# Patient Record
Sex: Male | Born: 1990 | Race: White | Hispanic: No | Marital: Single | Smoking: Never smoker
Health system: Southern US, Community
[De-identification: ages and names within clinical notes are randomized; demographics above are authoritative.]

## PROBLEM LIST (undated history)

## (undated) DIAGNOSIS — I456 Pre-excitation syndrome: Secondary | ICD-10-CM

## (undated) HISTORY — PX: ABLATION: SHX5711

---

## 2017-06-04 ENCOUNTER — Other Ambulatory Visit: Payer: Self-pay

## 2017-06-04 ENCOUNTER — Encounter (HOSPITAL_BASED_OUTPATIENT_CLINIC_OR_DEPARTMENT_OTHER): Payer: Self-pay

## 2017-06-04 ENCOUNTER — Emergency Department (HOSPITAL_BASED_OUTPATIENT_CLINIC_OR_DEPARTMENT_OTHER)
Admission: EM | Admit: 2017-06-04 | Discharge: 2017-06-05 | Disposition: A | Discharge status: Home / Self Care | Payer: BLUE CROSS/BLUE SHIELD | Attending: Emergency Medicine | Admitting: Emergency Medicine

## 2017-06-04 DIAGNOSIS — F329 Major depressive disorder, single episode, unspecified: Secondary | ICD-10-CM | POA: Diagnosis not present

## 2017-06-04 DIAGNOSIS — R45851 Suicidal ideations: Secondary | ICD-10-CM | POA: Diagnosis not present

## 2017-06-04 HISTORY — DX: Pre-excitation syndrome: I45.6

## 2017-06-04 LAB — CBC
HEMATOCRIT: 46.4 % (ref 39.0–52.0)
Hemoglobin: 15.8 g/dL (ref 13.0–17.0)
MCH: 27.2 pg (ref 26.0–34.0)
MCHC: 34.1 g/dL (ref 30.0–36.0)
MCV: 80 fL (ref 78.0–100.0)
PLATELETS: 284 10*3/uL (ref 150–400)
RBC: 5.8 MIL/uL (ref 4.22–5.81)
RDW: 13.7 % (ref 11.5–15.5)
WBC: 9.9 10*3/uL (ref 4.0–10.5)

## 2017-06-04 LAB — COMPREHENSIVE METABOLIC PANEL
ALK PHOS: 73 U/L (ref 38–126)
ALT: 34 U/L (ref 17–63)
ANION GAP: 10 (ref 5–15)
AST: 25 U/L (ref 15–41)
Albumin: 4.3 g/dL (ref 3.5–5.0)
BILIRUBIN TOTAL: 0.3 mg/dL (ref 0.3–1.2)
BUN: 9 mg/dL (ref 6–20)
CALCIUM: 8.8 mg/dL — AB (ref 8.9–10.3)
CO2: 24 mmol/L (ref 22–32)
Chloride: 102 mmol/L (ref 101–111)
Creatinine, Ser: 0.88 mg/dL (ref 0.61–1.24)
GFR calc Af Amer: >60 mL/min (ref >60)
GLUCOSE: 108 mg/dL — AB (ref 65–99)
POTASSIUM: 3.2 mmol/L — AB (ref 3.5–5.1)
Sodium: 136 mmol/L (ref 135–145)
TOTAL PROTEIN: 7.9 g/dL (ref 6.5–8.1)

## 2017-06-04 LAB — RAPID URINE DRUG SCREEN, HOSP PERFORMED
Amphetamines: NOT DETECTED
BARBITURATES: NOT DETECTED
Benzodiazepines: NOT DETECTED
Cocaine: NOT DETECTED
Opiates: NOT DETECTED
Tetrahydrocannabinol: NOT DETECTED

## 2017-06-04 LAB — ETHANOL

## 2017-06-04 LAB — SALICYLATE LEVEL: Salicylate Lvl: <7 mg/dL (ref 2.8–30.0)

## 2017-06-04 LAB — ACETAMINOPHEN LEVEL: Acetaminophen (Tylenol), Serum: <10 ug/mL — ABNORMAL LOW (ref 10–30)

## 2017-06-04 MED ORDER — POTASSIUM CHLORIDE CRYS ER 20 MEQ PO TBCR
40.0000 meq | EXTENDED_RELEASE_TABLET | Freq: Once | ORAL | Status: AC
  Administered 2017-06-04: 40 meq via ORAL
  Filled 2017-06-04: qty 2

## 2017-06-04 NOTE — ED Notes (Signed)
All personal belongings removed and placed in belongings bag and locked at nsg desk. Room cleared of all extra equipment per policy. Pt wearing burgundy scrubs and given socks. Pt wanded by security for weapons.

## 2017-06-04 NOTE — ED Notes (Signed)
Pt given water per request

## 2017-06-04 NOTE — ED Notes (Signed)
Pt states he is feeling depressed and unsafe to be alone. Pt reports thoughts of self harm, but he doesn't have a plan. Pt reports situation with friends today that made him feel like they were his friends. Pt also states he lives at home with his parents who are supportive, but he also identifies this situation as being a stressor. Pt is tearful at times. Pt is cooperative. Mother called to make sure pt was here and safe-permission was obtained from pt to let mother know he was here, safe and being evaluated. Plan of care explained to pt and he verbalized understanding.

## 2017-06-04 NOTE — ED Triage Notes (Signed)
Pt "I'm scared to go home because I think I might kill myself"-pt began crying-NAD-steady gait

## 2017-06-04 NOTE — ED Notes (Signed)
Staffing notified that a sitter was needed

## 2017-06-05 ENCOUNTER — Encounter (HOSPITAL_BASED_OUTPATIENT_CLINIC_OR_DEPARTMENT_OTHER): Payer: Self-pay | Admitting: Emergency Medicine

## 2017-06-05 NOTE — ED Notes (Signed)
TTS recommends pt can go home. Pt signed contract for safety. Pt given resource guide for out pt treatment.

## 2017-06-05 NOTE — BH Assessment (Addendum)
Tele Assessment Note   Patient Name: Ronald Diaz MRN: [id number] Referring Physician: Dr. Dena Billet Location of Patient: [facility] Location of Provider: Behavioral Health TTS Department  [name] is an 27 y.o. male. The pt came in after having suicidal thoughts.  He reported he had accidentally offended his best friend and his best friend's wife.  Today while at work his best friend's wife "went off" on him.  After hearing from his best friend's wife, the pt began to start having thoughts that he didn't matter.   He stated he started thinking about how he lives at home with his mother, doesn't have a girlfriend, and plays video games all day.  He said he is also having "drama" with his sister.  He started to have thoughts of shooting himself with his gun.  He denies ever trying to hurt himself in the past and any mental health history.  He denies any prior drug or substance use and his UDS was negative for all substances.  Prior to today he denied any depression symptoms.  He now denies SI and feels safe to go home.  He denies HI, SA and psychosis.  Diagnosis: F43.21 Adjustment disorder, With depressed mood   Past Medical History:  Past Medical History:  Diagnosis Date  . WPW (Wolff-Parkinson-White syndrome)     Past Surgical History:  Procedure Laterality Date  . ABLATION      Family History: No family history on file.  Social History:  reports that  has never smoked. he has never used smokeless tobacco. He reports that he drinks alcohol. He reports that he does not use drugs.  Additional Social History:  Alcohol / Drug Use Pain Medications: See MAR Prescriptions: See MAR Over the Counter: See MAR History of alcohol / drug use?: No history of alcohol / drug abuse Longest period of sobriety (when/how long): NA  CIWA: CIWA-Ar BP: (!) 139/92 Pulse Rate: (!) 101 COWS:    Allergies: No Known Allergies  Home Medications:  (Not in a hospital  admission)  OB/GYN Status:  No LMP for male patient.  General Assessment Data Location of Assessment: [facility] Assessment Services([facility]) TTS Assessment: In system Is this a Tele or Face-to-Face Assessment?: Tele Assessment Is this an Initial Assessment or a Re-assessment for this encounter?: Initial Assessment Marital status: Single Maiden name: NA Is patient pregnant?: Other (Comment)(male) Living Arrangements: Parent Can pt return to current living arrangement?: Yes Admission Status: Voluntary Is patient capable of signing voluntary admission?: Yes Referral Source: Self/Family/Friend Insurance type: BCBS     Crisis Care Plan Living Arrangements: Parent Legal Guardian: Other:(Self) Name of Psychiatrist: none Name of Therapist: none  Education Status Is patient currently in school?: No Current Grade: NA Highest grade of school patient has completed: 2 years of college Name of school: NA Contact person: NA  Risk to self with the past 6 months Suicidal Ideation: No-Not Currently/Within Last 6 Months Has patient been a risk to self within the past 6 months prior to admission? : Yes Suicidal Intent: No-Not Currently/Within Last 6 Months Has patient had any suicidal intent within the past 6 months prior to admission? : No Is patient at risk for suicide?: Yes Suicidal Plan?: No-Not Currently/Within Last 6 Months Has patient had any suicidal plan within the past 6 months prior to admission? : Yes Access to Means: Yes Specify Access to Suicidal Means: has a gun at home What has been your use of drugs/alcohol within the last 12 months?:  none Previous Attempts/Gestures: No How many times?: 0 Other Self Harm Risks: none Triggers for Past Attempts: None known Intentional Self Injurious Behavior: None Family Suicide History: No Recent stressful life event(s): Conflict (Comment)(argument with best friend's wife) Persecutory voices/beliefs?: No Depression:  No Depression Symptoms: Feeling worthless/self pity Substance abuse history and/or treatment for substance abuse?: No Suicide prevention information given to non-admitted patients: Yes  Risk to Others within the past 6 months Homicidal Ideation: No Does patient have any lifetime risk of violence toward others beyond the six months prior to admission? : No Thoughts of Harm to Others: No Current Homicidal Intent: No Current Homicidal Plan: No Access to Homicidal Means: No Identified Victim: NA History of harm to others?: No Assessment of Violence: None Noted Violent Behavior Description: none Does patient have access to weapons?: Yes (Comment)(gun at home on night stand in bedroom) Criminal Charges Pending?: No Does patient have a court date: No Is patient on probation?: No  Psychosis Hallucinations: None noted Delusions: None noted  Mental Status Report Appearance/Hygiene: In scrubs, Unremarkable Eye Contact: Good Motor Activity: Unremarkable, Freedom of movement Speech: Logical/coherent Level of Consciousness: Alert Mood: Depressed Affect: Appropriate to circumstance Anxiety Level: None Thought Processes: Coherent, Relevant Judgement: Partial Orientation: Person, Place, Time, Situation, Appropriate for developmental age Obsessive Compulsive Thoughts/Behaviors: None  Cognitive Functioning Concentration: Normal Memory: Recent Intact, Remote Intact IQ: Average Insight: Fair Impulse Control: Fair Appetite: Good Weight Loss: 0 Weight Gain: 0 Sleep: No Change Total Hours of Sleep: 8 Vegetative Symptoms: None  ADLScreening [facility] Assessment Services) Patient's cognitive ability adequate to safely complete daily activities?: Yes Patient able to express need for assistance with ADLs?: Yes Independently performs ADLs?: Yes (appropriate for developmental age)  Prior Inpatient Therapy Prior Inpatient Therapy: No Prior Therapy Dates: NA Prior Therapy Facilty/Provider(s):  NA Reason for Treatment: NA  Prior Outpatient Therapy Prior Outpatient Therapy: No Prior Therapy Dates: NA Prior Therapy Facilty/Provider(s): NA Reason for Treatment: NA Does patient have an ACCT team?: No Does patient have Intensive In-House Services?  : No Does patient have [facility] services? : No Does patient have P4CC services?: No  ADL Screening (condition at time of admission) Patient's cognitive ability adequate to safely complete daily activities?: Yes Patient able to express need for assistance with ADLs?: Yes Independently performs ADLs?: Yes (appropriate for developmental age)       Abuse/Neglect Assessment (Assessment to be complete while patient is alone) Abuse/Neglect Assessment Can Be Completed: Yes Physical Abuse: Denies Verbal Abuse: Denies Sexual Abuse: Denies Exploitation of patient/patient's resources: Denies Self-Neglect: Denies Values / Beliefs Cultural Requests During Hospitalization: None Spiritual Requests During Hospitalization: None Consults Spiritual Care Consult Needed: No Social Work Consult Needed: No Merchant navy officer (For Healthcare) Does Patient Have a Medical Advance Directive?: No Would patient like information on creating a medical advance directive?: No - Patient declined    Additional Information 1:1 In Past 12 Months?: No CIRT Risk: No Elopement Risk: No Does patient have medical clearance?: Yes     Disposition:  Disposition Initial Assessment Completed for this Encounter: Yes Disposition of Patient: Outpatient treatment Type of outpatient treatment: Adult  Donell Sievert, PA recommends outpatient treatment.  RN Caryl Asp, was made aware of the recommendations.  This service was provided via telemedicine using a 2-way, interactive audio and video technology.  Names of all persons participating in this telemedicine service and their role in this encounter. Name: Riley Churches Role: TTS  Name: Kary Kos Role: PT  Name:   Role:   Name:  Role:     Riley ChurchesGarvin, Janet Decesare [facility] HospitalJermaine 06/05/2017 12:21 AM

## 2017-06-05 NOTE — ED Notes (Signed)
TTS consult is complete

## 2017-06-05 NOTE — ED Provider Notes (Signed)
[facility] EMERGENCY DEPARTMENT Provider Note   CSN: [id number] Arrival date & time: 06/04/17  2132     History   Chief Complaint Chief Complaint  Patient presents with  . Suicidal    HPI Ronald Diaz is a 27 y.o. male.  The history is provided by the patient.  Depression  This is a new problem. The current episode started 12 to 24 hours ago. The problem occurs constantly. The problem has been rapidly improving. Pertinent negatives include no chest pain, no abdominal pain, no headaches and no shortness of breath. Nothing aggravates the symptoms. Nothing relieves the symptoms. He has tried nothing for the symptoms. The treatment provided mild relief.  Patient reports that his best friend's wife was angry at him because he said some "stupid things" to him.  He was upset that he might lose them as friends as he considers them more like family.  He transiently thought life might be better without him.  No ETOH.  No HI.  No AH or VH.  States he has never had this problem but he was really upset about losing them as friends.  Having those thoughts scared him so he came in for evaluation.    Past Medical History:  Diagnosis Date  . WPW (Wolff-Parkinson-White syndrome)     There are no active problems to display for this patient.   Past Surgical History:  Procedure Laterality Date  . ABLATION         Home Medications    Prior to Admission medications   Not on File    Family History No family history on file.  Social History Social History   Tobacco Use  . Smoking status: Never Smoker  . Smokeless tobacco: Never Used  Substance Use Topics  . Alcohol use: Yes    Comment: rare  . Drug use: No     Allergies   Patient has no known allergies.   Review of Systems Review of Systems  Respiratory: Negative for shortness of breath.   Cardiovascular: Negative for chest pain.  Gastrointestinal: Negative for abdominal pain.  Neurological: Negative for  headaches.  Psychiatric/Behavioral: Positive for depression. Negative for self-injury.  All other systems reviewed and are negative.    Physical Exam Updated Vital Signs BP (!) 139/92 (BP Location: Left Arm)   Pulse (!) 101   Temp 98.2 F (36.8 C) (Oral)   Resp 20   Ht 5\' 9"  (1.753 m)   Wt 99.8 kg (220 lb)   SpO2 98%   BMI 32.49 kg/m   Physical Exam  Constitutional: He is oriented to person, place, and time. He appears well-developed and well-nourished. No distress.  HENT:  Head: Normocephalic and atraumatic.  Nose: Nose normal.  Mouth/Throat: No oropharyngeal exudate.  Eyes: Conjunctivae are normal. Pupils are equal, round, and reactive to light.  Neck: Normal range of motion. Neck supple.  Cardiovascular: Normal rate, regular rhythm, normal heart sounds and intact distal pulses.  Pulmonary/Chest: Effort normal and breath sounds normal. No stridor. He has no wheezes. He has no rales.  Abdominal: Soft. Bowel sounds are normal. There is no tenderness.  Musculoskeletal: Normal range of motion.  Neurological: He is alert and oriented to person, place, and time. He displays normal reflexes.  Skin: Skin is warm and dry. Capillary refill takes less than 2 seconds.  Psychiatric: He has a normal mood and affect.     ED Treatments / Results  Labs (all labs ordered are listed, but only abnormal results  are displayed) Labs Reviewed  COMPREHENSIVE METABOLIC PANEL - Abnormal; Notable for the following components:      Result Value   Potassium 3.2 (*)    Glucose, Bld 108 (*)    Calcium 8.8 (*)    All other components within normal limits  ACETAMINOPHEN LEVEL - Abnormal; Notable for the following components:   Acetaminophen (Tylenol), Serum <10 (*)    All other components within normal limits  ETHANOL  SALICYLATE LEVEL  CBC  RAPID URINE DRUG SCREEN, HOSP PERFORMED    EKG  EKG Interpretation None       Radiology No results found.  Procedures Procedures (including  critical care time)  Medications Ordered in ED Medications  potassium chloride SA (K-DUR,KLOR-CON) CR tablet 40 mEq (40 mEq Oral Given 06/04/17 2342)      Final Clinical Impressions(s) / ED Diagnoses   Final diagnoses:  Depressive reaction    Cleared by TTS, contracted for safety is living with his parents.    Return for weakness, numbness, changes in vision or speech,  fevers > 100.4 unrelieved by medication, shortness of breath, intractable vomiting, or diarrhea, abdominal pain, Inability to tolerate liquids or food, cough, altered mental status or any concerns. No signs of systemic illness or infection. The patient is nontoxic-appearing on exam and vital signs are within normal limits.    I have reviewed the triage vital signs and the nursing notes. Pertinent labs &imaging results that were available during my care of the patient were reviewed by me and considered in my medical decision making (see chart for details).  After history, exam, and medical workup I feel the patient has been appropriately medically screened and is safe for discharge home. Pertinent diagnoses were discussed with the patient. Patient was given return precautions.   Ofilia Rayon, MD 06/05/17 [phone]

## 2019-05-02 ENCOUNTER — Other Ambulatory Visit: Payer: Self-pay

## 2019-05-02 ENCOUNTER — Encounter (HOSPITAL_BASED_OUTPATIENT_CLINIC_OR_DEPARTMENT_OTHER): Payer: Self-pay | Admitting: Emergency Medicine

## 2019-05-02 ENCOUNTER — Emergency Department (HOSPITAL_BASED_OUTPATIENT_CLINIC_OR_DEPARTMENT_OTHER)
Admission: EM | Admit: 2019-05-02 | Discharge: 2019-05-02 | Disposition: A | Discharge status: Home / Self Care | Payer: HRSA Program | Attending: Emergency Medicine | Admitting: Emergency Medicine

## 2019-05-02 ENCOUNTER — Emergency Department (HOSPITAL_BASED_OUTPATIENT_CLINIC_OR_DEPARTMENT_OTHER): Payer: HRSA Program

## 2019-05-02 DIAGNOSIS — U071 COVID-19: Secondary | ICD-10-CM | POA: Insufficient documentation

## 2019-05-02 DIAGNOSIS — R0600 Dyspnea, unspecified: Secondary | ICD-10-CM

## 2019-05-02 DIAGNOSIS — R042 Hemoptysis: Secondary | ICD-10-CM | POA: Diagnosis not present

## 2019-05-02 DIAGNOSIS — R0602 Shortness of breath: Secondary | ICD-10-CM | POA: Diagnosis present

## 2019-05-02 LAB — CBC WITH DIFFERENTIAL/PLATELET
Abs Immature Granulocytes: 0.01 10*3/uL (ref 0.00–0.07)
Basophils Absolute: 0 10*3/uL (ref 0.0–0.1)
Basophils Relative: 0 %
Eosinophils Absolute: 0 10*3/uL (ref 0.0–0.5)
Eosinophils Relative: 0 %
HCT: 49.1 % (ref 39.0–52.0)
Hemoglobin: 16.4 g/dL (ref 13.0–17.0)
Immature Granulocytes: 0 %
Lymphocytes Relative: 29 %
Lymphs Abs: 1.5 10*3/uL (ref 0.7–4.0)
MCH: 27.1 pg (ref 26.0–34.0)
MCHC: 33.4 g/dL (ref 30.0–36.0)
MCV: 81.2 fL (ref 80.0–100.0)
Monocytes Absolute: 0.3 10*3/uL (ref 0.1–1.0)
Monocytes Relative: 6 %
Neutro Abs: 3.5 10*3/uL (ref 1.7–7.7)
Neutrophils Relative %: 65 %
Platelets: 235 10*3/uL (ref 150–400)
RBC: 6.05 MIL/uL — ABNORMAL HIGH (ref 4.22–5.81)
RDW: 13.2 % (ref 11.5–15.5)
WBC: 5.4 10*3/uL (ref 4.0–10.5)
nRBC: 0 % (ref 0.0–0.2)

## 2019-05-02 LAB — COMPREHENSIVE METABOLIC PANEL
ALT: 60 U/L — ABNORMAL HIGH (ref 0–44)
AST: 41 U/L (ref 15–41)
Albumin: 4.4 g/dL (ref 3.5–5.0)
Alkaline Phosphatase: 75 U/L (ref 38–126)
Anion gap: 12 (ref 5–15)
BUN: 11 mg/dL (ref 6–20)
CO2: 21 mmol/L — ABNORMAL LOW (ref 22–32)
Calcium: 9.2 mg/dL (ref 8.9–10.3)
Chloride: 105 mmol/L (ref 98–111)
Creatinine, Ser: 0.81 mg/dL (ref 0.61–1.24)
GFR calc Af Amer: >60 mL/min (ref >60)
GFR calc non Af Amer: >60 mL/min (ref >60)
Glucose, Bld: 83 mg/dL (ref 70–99)
Potassium: 3.5 mmol/L (ref 3.5–5.1)
Sodium: 138 mmol/L (ref 135–145)
Total Bilirubin: 0.9 mg/dL (ref 0.3–1.2)
Total Protein: 7.9 g/dL (ref 6.5–8.1)

## 2019-05-02 LAB — PROCALCITONIN: Procalcitonin: <0.1 ng/mL

## 2019-05-02 LAB — TRIGLYCERIDES: Triglycerides: 176 mg/dL — ABNORMAL HIGH (ref <150)

## 2019-05-02 LAB — FERRITIN: Ferritin: 379 ng/mL — ABNORMAL HIGH (ref 24–336)

## 2019-05-02 LAB — C-REACTIVE PROTEIN: CRP: 1.4 mg/dL — ABNORMAL HIGH (ref <1.0)

## 2019-05-02 LAB — LACTATE DEHYDROGENASE: LDH: 152 U/L (ref 98–192)

## 2019-05-02 LAB — FIBRINOGEN: Fibrinogen: 441 mg/dL (ref 210–475)

## 2019-05-02 LAB — LACTIC ACID, PLASMA: Lactic Acid, Venous: 1.2 mmol/L (ref 0.5–1.9)

## 2019-05-02 LAB — D-DIMER, QUANTITATIVE: D-Dimer, Quant: <0.27 ug/mL-FEU (ref 0.00–0.50)

## 2019-05-02 MED ORDER — SODIUM CHLORIDE 0.9 % IV BOLUS
1000.0000 mL | Freq: Once | INTRAVENOUS | Status: AC
  Administered 2019-05-02: 17:00:00 1000 mL via INTRAVENOUS

## 2019-05-02 MED ORDER — ALBUTEROL SULFATE HFA 108 (90 BASE) MCG/ACT IN AERS: 1.0000 | INHALATION_SPRAY | Freq: Four times a day (QID) | RESPIRATORY_TRACT | 0 refills | Status: AC | PRN

## 2019-05-02 MED ORDER — IBUPROFEN 800 MG PO TABS
800.0000 mg | ORAL_TABLET | Freq: Once | ORAL | Status: AC
  Administered 2019-05-02: 800 mg via ORAL
  Filled 2019-05-02: qty 1

## 2019-05-02 MED ORDER — DEXAMETHASONE SODIUM PHOSPHATE 10 MG/ML IJ SOLN
10.0000 mg | Freq: Once | INTRAMUSCULAR | Status: AC
  Administered 2019-05-02: 10 mg via INTRAVENOUS
  Filled 2019-05-02: qty 1

## 2019-05-02 MED ORDER — METHYLPREDNISOLONE 4 MG PO TBPK: ORAL_TABLET | ORAL | 0 refills | Status: AC

## 2019-05-02 NOTE — ED Triage Notes (Signed)
Diagnosed with COVID Wednesday. C/o worsening SOB and cough.

## 2019-05-02 NOTE — ED Provider Notes (Signed)
[facility] EMERGENCY DEPARTMENT Provider Note   CSN: [id number] Arrival date & time: 05/02/19  1530     History Chief Complaint  Patient presents with  . Shortness of Breath    COVID+    Ronald Diaz is a 29 y.o. male who presents emergency department chief complaint of shortness of breath.  He has a past medical history of Wolff-Parkinson-White.  Patient was diagnosed with Covid virus last week at [facility].  Symptoms began on 04/26/2019.  Patient states that 3 days ago he began having worsening shortness of breath.  He used Allegra-D and Tylenol to relieve his symptoms.  He had improvement in his breathing and was able to expectorate a lot of phlegm.  Patient states that he noticed there is a little bit of 1 to 2 mm specks of blood in his phlegm at that time.  Patient states that his breathing has become worse.  He has had some retrosternal chest pain and has been coughing up more pink, blood-tinged sputum.  He spoke with a telehealth specialist who asked him to come to the emergency department. He denies a history of pulmonary embolus, unilateral leg swelling. HPI     Past Medical History:  Diagnosis Date  . WPW (Wolff-Parkinson-White syndrome)     There are no problems to display for this patient.   Past Surgical History:  Procedure Laterality Date  . ABLATION         No family history on file.  Social History   Tobacco Use  . Smoking status: Never Smoker  . Smokeless tobacco: Never Used  Substance Use Topics  . Alcohol use: Yes    Comment: rare  . Drug use: No    Home Medications Prior to Admission medications   Medication Sig Start Date End Date Taking? Authorizing Provider  albuterol (VENTOLIN HFA) 108 (90 Base) MCG/ACT inhaler Inhale 1-2 puffs into the lungs every 6 (six) hours as needed for wheezing or shortness of breath. 05/02/19   Margarita Mail, PA-C  methylPREDNISolone (MEDROL DOSEPAK) 4 MG TBPK tablet Use as directed 05/02/19   Margarita Mail, PA-C    Allergies    Patient has no known allergies.  Review of Systems   Review of Systems Ten systems reviewed and are negative for acute change, except as noted in the HPI.   Physical Exam Updated Vital Signs BP 126/80   Pulse (!) 107   Temp 98.9 F (37.2 C) (Oral)   Resp 20   Ht 5\' 9"  (1.753 m)   Wt 99.8 kg   SpO2 98%   BMI 32.49 kg/m   Physical Exam Vitals and nursing note reviewed.  Constitutional:      General: He is not in acute distress.    Appearance: He is well-developed. He is not diaphoretic.  HENT:     Head: Normocephalic and atraumatic.  Eyes:     General: No scleral icterus.    Conjunctiva/sclera: Conjunctivae normal.  Cardiovascular:     Rate and Rhythm: Regular rhythm. Tachycardia present.     Heart sounds: Normal heart sounds.  Pulmonary:     Effort: Pulmonary effort is normal. Tachypnea present. No respiratory distress.     Breath sounds: Normal breath sounds.  Abdominal:     Palpations: Abdomen is soft.     Tenderness: There is no abdominal tenderness.  Musculoskeletal:     Cervical back: Normal range of motion and neck supple.  Skin:    General: Skin is warm and dry.  Neurological:     Mental Status: He is alert.  Psychiatric:        Behavior: Behavior normal.     ED Results / Procedures / Treatments   Labs (all labs ordered are listed, but only abnormal results are displayed) Labs Reviewed  CBC WITH DIFFERENTIAL/PLATELET - Abnormal; Notable for the following components:      Result Value   RBC 6.05 (*)    All other components within normal limits  COMPREHENSIVE METABOLIC PANEL - Abnormal; Notable for the following components:   CO2 21 (*)    ALT 60 (*)    All other components within normal limits  FERRITIN - Abnormal; Notable for the following components:   Ferritin 379 (*)    All other components within normal limits  TRIGLYCERIDES - Abnormal; Notable for the following components:   Triglycerides 176 (*)    All  other components within normal limits  C-REACTIVE PROTEIN - Abnormal; Notable for the following components:   CRP 1.4 (*)    All other components within normal limits  SARS CORONAVIRUS 2 (TAT 6-24 HRS)  CULTURE, BLOOD (ROUTINE X 2)  CULTURE, BLOOD (ROUTINE X 2)  LACTIC ACID, PLASMA  D-DIMER, QUANTITATIVE (NOT AT [facility])  PROCALCITONIN  LACTATE DEHYDROGENASE  FIBRINOGEN    EKG EKG Interpretation  Date/Time:  Sunday May 02 2019 16:24:06 EST Ventricular Rate:  127 PR Interval:    QRS Duration: 71 QT Interval:  294 QTC Calculation: 428 R Axis:   57 Text Interpretation: Sinus tachycardia Probable left atrial enlargement No STEMI Confirmed by Alvester Chou [phone]) on 05/02/2019 4:36:43 PM   Radiology DG Chest Portable 1 View  Result Date: 05/02/2019 CLINICAL DATA:  C/o cough, body aches and fever x 1 wk but started having light pink hemoptysis when coughing since yesterday. Had positive COVID test on Wednesday this week EXAM: PORTABLE CHEST 1 VIEW COMPARISON:  None. FINDINGS: The heart size and mediastinal contours are within normal limits. The lungs are clear. No pneumothorax or significant pleural effusion. No acute finding in the visualized skeleton. IMPRESSION: No evidence of active disease in the chest. Electronically Signed   By: Emmaline Kluver M.D.   On: 05/02/2019 16:22    Procedures Procedures (including critical care time)  Medications Ordered in ED Medications  ibuprofen (ADVIL) tablet 800 mg (800 mg Oral Given 05/02/19 1635)  sodium chloride 0.9 % bolus 1,000 mL (0 mLs Intravenous Stopped 05/02/19 1800)  dexamethasone (DECADRON) injection 10 mg (10 mg Intravenous Given 05/02/19 1634)    ED Course  I have reviewed the triage vital signs and the nursing notes.  Pertinent labs & imaging results that were available during my care of the patient were reviewed by me and considered in my medical decision making (see chart for details).    MDM Rules/Calculators/A&P                       [id number] + Hemoptysis VS: BP 126/80   Pulse (!) 107   Temp 98.9 F (37.2 C) (Oral)   Resp 20   Ht 5\' 9"  (1.753 m)   Wt 99.8 kg   SpO2 98%   BMI 32.49 kg/m  is gathered by patient and EMR. DDX: Differential diagnosis of hemoptysis includes, but is not limited to: bronchitis, bronchiectasis, bronchogenic carcinoma, pulmonary vasculature disease, left ventricular failure, mitral stenosis, pulmonary embolism, AV malformation, diseases of the pulmonary parenchyma including pneumonia, abscess, tuberculosis, Aspergilloma parasitic infection, Goodpasture's or Wegener's granulomatosis, SLE,  crack cocaine inhalation, leukemia, anticoagulation or acute cough. Labs: I reviewed the labs which show elevated inflammatory markers present.  Negative lactic acid.  D-dimer is negative.  CBC without elevated white blood cell count.  Mild elevated ALT. Imaging: I personally reviewed the images (1 view chest x-ray) which show(s) acute abnormalities on my interpretation EKG: Sinus tachycardia MDM: Patient here with complaint of small amounts of minute red spiculated blood in his sputum and then pink sputum.  He has a negative D-dimer with positive known Covid.  Think given this he does not need a CT angiogram to rule out pulmonary embolus.  Patient has had no hypoxia or tachypnea here in the emergency department.  He was febrile with tachycardia but his heart rate has improved significantly after fluids and defervesced since of his fever.  Feel the patient is safe to be discharged at this time. Patient disposition: Discharge Patient condition: Good. The patient appears reasonably screened and/or stabilized for discharge and I doubt any other medical condition or other [facility] requiring further screening, evaluation, or treatment in the ED at this time prior to discharge. I have discussed lab and/or imaging findings with the patient and answered all questions/concerns to the best of my  ability. I have discussed return precautions and OP follow up.      Final Clinical Impression(s) / ED Diagnoses Final diagnoses:  COVID-19 virus infection  Dyspnea, unspecified type  Cough with hemoptysis    Rx / DC Orders ED Discharge Orders         Ordered    methylPREDNISolone (MEDROL DOSEPAK) 4 MG TBPK tablet     05/02/19 2052    albuterol (VENTOLIN HFA) 108 (90 Base) MCG/ACT inhaler  Every 6 hours PRN     05/02/19 2052           Arthor Captain, PA-C 05/03/19 Audley Hose, MD 05/03/19 1504

## 2019-05-02 NOTE — Discharge Instructions (Addendum)
Person Under Monitoring Name: Ronald Diaz  Location: [address] [place] [address]   Infection Prevention Recommendations for Individuals Confirmed to have, or Being Evaluated for, 2019 Novel Coronavirus (COVID-19) Infection Who Receive Care at Home  Individuals who are confirmed to have, or are being evaluated for, COVID-19 should follow the prevention steps below until a healthcare provider or local or state health department says they can return to normal activities.  Stay home except to get medical care You should restrict activities outside your home, except for getting medical care. Do not go to work, school, or public areas, and do not use public transportation or taxis.  Call ahead before visiting your doctor Before your medical appointment, call the healthcare provider and tell them that you have, or are being evaluated for, COVID-19 infection. This will help the healthcare provider's office take steps to keep other people from getting infected. Ask your healthcare provider to call the local or state health department.  Monitor your symptoms Seek prompt medical attention if your illness is worsening (e.g., difficulty breathing). Before going to your medical appointment, call the healthcare provider and tell them that you have, or are being evaluated for, COVID-19 infection. Ask your healthcare provider to call the local or state health department.  Wear a facemask You should wear a facemask that covers your nose and mouth when you are in the same room with other people and when you visit a healthcare provider. People who live with or visit you should also wear a facemask while they are in the same room with you.  Separate yourself from other people in your home As much as possible, you should stay in a different room from other people in your home. Also, you should use a separate bathroom, if available.  Avoid sharing household items You should not  share dishes, drinking glasses, cups, eating utensils, towels, bedding, or other items with other people in your home. After using these items, you should wash them thoroughly with soap and water.  Cover your coughs and sneezes Cover your mouth and nose with a tissue when you cough or sneeze, or you can cough or sneeze into your sleeve. Throw used tissues in a lined trash can, and immediately wash your hands with soap and water for at least 20 seconds or use an alcohol-based hand rub.  Wash your [employer] your hands often and thoroughly with soap and water for at least 20 seconds. You can use an alcohol-based hand sanitizer if soap and water are not available and if your hands are not visibly dirty. Avoid touching your eyes, nose, and mouth with unwashed hands.   Prevention Steps for Caregivers and Household Members of Individuals Confirmed to have, or Being Evaluated for, COVID-19 Infection Being Cared for in the Home  If you live with, or provide care at home for, a person confirmed to have, or being evaluated for, COVID-19 infection please follow these guidelines to prevent infection:  Follow healthcare provider's instructions Make sure that you understand and can help the patient follow any healthcare provider instructions for all care.  Provide for the patient's basic needs You should help the patient with basic needs in the home and provide support for getting groceries, prescriptions, and other personal needs.  Monitor the patient's symptoms If they are getting sicker, call his or her medical provider and tell them that the patient has, or is being evaluated for, COVID-19 infection. This will help the healthcare  provider's office take steps to keep other people from getting infected. Ask the healthcare provider to call the local or state health department.  Limit the number of people who have contact with the patient If possible, have only one caregiver for the  patient. Other household members should stay in another home or place of residence. If this is not possible, they should stay in another room, or be separated from the patient as much as possible. Use a separate bathroom, if available. Restrict visitors who do not have an essential need to be in the home.  Keep older adults, very young children, and other sick people away from the patient Keep older adults, very young children, and those who have compromised immune systems or chronic health conditions away from the patient. This includes people with chronic heart, lung, or kidney conditions, diabetes, and cancer.  Ensure good ventilation Make sure that shared spaces in the home have good air flow, such as from an air conditioner or an opened window, weather permitting.  Wash your hands often Wash your hands often and thoroughly with soap and water for at least 20 seconds. You can use an alcohol based hand sanitizer if soap and water are not available and if your hands are not visibly dirty. Avoid touching your eyes, nose, and mouth with unwashed hands. Use disposable paper towels to dry your hands. If not available, use dedicated cloth towels and replace them when they become wet.  Wear a facemask and gloves Wear a disposable facemask at all times in the room and gloves when you touch or have contact with the patient's blood, body fluids, and/or secretions or excretions, such as sweat, saliva, sputum, nasal mucus, vomit, urine, or feces.  Ensure the mask fits over your nose and mouth tightly, and do not touch it during use. Throw out disposable facemasks and gloves after using them. Do not reuse. Wash your hands immediately after removing your facemask and gloves. If your personal clothing becomes contaminated, carefully remove clothing and launder. Wash your hands after handling contaminated clothing. Place all used disposable facemasks, gloves, and other waste in a lined container before  disposing them with other household waste. Remove gloves and wash your hands immediately after handling these items.  Do not share dishes, glasses, or other household items with the patient Avoid sharing household items. You should not share dishes, drinking glasses, cups, eating utensils, towels, bedding, or other items with a patient who is confirmed to have, or being evaluated for, COVID-19 infection. After the person uses these items, you should wash them thoroughly with soap and water.  Wash laundry thoroughly Immediately remove and wash clothes or bedding that have blood, body fluids, and/or secretions or excretions, such as sweat, saliva, sputum, nasal mucus, vomit, urine, or feces, on them. Wear gloves when handling laundry from the patient. Read and follow directions on labels of laundry or clothing items and detergent. In general, wash and dry with the warmest temperatures recommended on the label.  Clean all areas the individual has used often Clean all touchable surfaces, such as counters, tabletops, doorknobs, bathroom fixtures, toilets, phones, keyboards, tablets, and bedside tables, every day. Also, clean any surfaces that may have blood, body fluids, and/or secretions or excretions on them. Wear gloves when cleaning surfaces the patient has come in contact with. Use a diluted bleach solution (e.g., dilute bleach with 1 part bleach and 10 parts water) or a household disinfectant with a label that says EPA-registered for coronaviruses. To make  a bleach solution at home, add 1 tablespoon of bleach to 1 quart (4 cups) of water. For a larger supply, add  cup of bleach to 1 gallon (16 cups) of water. Read labels of cleaning products and follow recommendations provided on product labels. Labels contain instructions for safe and effective use of the cleaning product including precautions you should take when applying the product, such as wearing gloves or eye protection and making sure you  have good ventilation during use of the product. Remove gloves and wash hands immediately after cleaning.  Monitor yourself for signs and symptoms of illness Caregivers and household members are considered close contacts, should monitor their health, and will be asked to limit movement outside of the home to the extent possible. Follow the monitoring steps for close contacts listed on the symptom monitoring form.   ? If you have additional questions, contact your local health department or call the epidemiologist on call at [phone] (available 24/7). ? This guidance is subject to change. For the most up-to-date guidance from [facility], please refer to their website: [email or web address]

## 2019-05-03 LAB — SARS CORONAVIRUS 2 (TAT 6-24 HRS): SARS Coronavirus 2: POSITIVE — AB

## 2019-05-07 LAB — CULTURE, BLOOD (ROUTINE X 2)
Culture: NO GROWTH
Culture: NO GROWTH
Special Requests: ADEQUATE
Special Requests: ADEQUATE

## 2020-06-27 ENCOUNTER — Other Ambulatory Visit: Payer: Self-pay

## 2020-06-27 ENCOUNTER — Emergency Department
Admission: RE | Admit: 2020-06-27 | Discharge: 2020-06-27 | Disposition: A | Discharge status: Home / Self Care | Payer: 59 | Source: Ambulatory Visit

## 2020-06-27 VITALS — BP 151/97 | HR 100 | Temp 98.2°F | Resp 17

## 2020-06-27 DIAGNOSIS — G4700 Insomnia, unspecified: Secondary | ICD-10-CM | POA: Diagnosis not present

## 2020-06-27 DIAGNOSIS — R42 Dizziness and giddiness: Secondary | ICD-10-CM

## 2020-06-27 DIAGNOSIS — R251 Tremor, unspecified: Secondary | ICD-10-CM

## 2020-06-27 LAB — POCT URINALYSIS DIP (MANUAL ENTRY)
Bilirubin, UA: NEGATIVE
Blood, UA: NEGATIVE
Glucose, UA: NEGATIVE mg/dL
Ketones, POC UA: NEGATIVE mg/dL
Leukocytes, UA: NEGATIVE
Nitrite, UA: NEGATIVE
Protein Ur, POC: NEGATIVE mg/dL
Spec Grav, UA: 1.02 (ref 1.010–1.025)
Urobilinogen, UA: 0.2 E.U./dL
pH, UA: 6.5 (ref 5.0–8.0)

## 2020-06-27 LAB — POCT FASTING CBG KUC MANUAL ENTRY: POCT Glucose (KUC): 90 mg/dL (ref 70–99)

## 2020-06-27 MED ORDER — HYDROXYZINE HCL 50 MG PO TABS: 50.0000 mg | ORAL_TABLET | Freq: Every day | ORAL | 0 refills | Status: AC

## 2020-06-27 NOTE — Discharge Instructions (Addendum)
Try hydroxyzine 50-100 mg one hour prior to bedtime for management of insomnia. You may also add over the counter melatonin to increase effect of hydroxyzine. Follow-up with your primary care doctor for further work-up and management of the other symptoms you are experiencing. You are overdue for a physical exam, I would recommend starting with an annual exam and if insomnia remains an issue, further work-up and evaluation .

## 2020-06-27 NOTE — ED Provider Notes (Signed)
[facility]    CSN: [id number] Arrival date & time: 06/27/20  0934      History   Chief Complaint Chief Complaint  Patient presents with  . Insomnia    HPI Ronald Diaz is a 30 y.o. male.   HPI  Patient presents today with multiple concerns related to 5 days of been unable to sleep which has subsequently resulted in episodes of forgetfulness, intermittent episodes about lateral tremulousness, and not feeling at his normal baseline.  Patient endorses a history of major depressive disorder however reports that he had never received any treatment for depression and at present does not feel depressed.  Denies prior episodes of panic attacks or previous diagnosis of a panic disorder.  He denies any current major stressors in his life.  Denies any increase in daily alcohol consumption.  He has not had a full complete physical in over 2 years.  He is established with a primary care provider.  Endorses normal eating and drinking habits.  Reports having Covid over 2 months ago does not feel current symptoms are similar.  Endorses palpitations intermittently since insomnia has been present.  He has had prior episodes of difficulty achieving sleep however has not had any complete work-up related to this problem.  Denies any current suicidal ideations, homicidal ideations or auditory hallucinations.  Past Medical History:  Diagnosis Date  . WPW (Wolff-Parkinson-White syndrome)     There are no problems to display for this patient.   Past Surgical History:  Procedure Laterality Date  . ABLATION       Home Medications    Prior to Admission medications   Medication Sig Start Date End Date Taking? Authorizing Provider  albuterol (VENTOLIN HFA) 108 (90 Base) MCG/ACT inhaler Inhale 1-2 puffs into the lungs every 6 (six) hours as needed for wheezing or shortness of breath. 05/02/19   Arthor Captain, PA-C  methylPREDNISolone (MEDROL DOSEPAK) 4 MG TBPK tablet Use as directed 05/02/19    Arthor Captain, PA-C    Family History History reviewed. No pertinent family history.  Social History Social History   Tobacco Use  . Smoking status: Never Smoker  . Smokeless tobacco: Never Used  Vaping Use  . Vaping Use: Never used  Substance Use Topics  . Alcohol use: Yes    Comment: rare  . Drug use: No     Allergies   Patient has no known allergies.   Review of Systems Review of Systems Pertinent negatives listed in HPI  Physical Exam Triage Vital Signs ED Triage Vitals  Enc Vitals Group     BP 06/27/20 0949 (!) 151/97     Pulse Rate 06/27/20 0949 100     Resp 06/27/20 0949 17     Temp 06/27/20 0949 98.2 F (36.8 C)     Temp Source 06/27/20 0949 Oral     SpO2 06/27/20 0949 98 %     Weight --      Height --      Head Circumference --      Peak Flow --      Pain Score 06/27/20 0954 0     Pain Loc --      Pain Edu? --      Excl. in GC? --    No data found.  Updated Vital Signs BP (!) 151/97 (BP Location: Left Arm)   Pulse 100   Temp 98.2 F (36.8 C) (Oral)   Resp 17   SpO2 98%   Visual Acuity Right  Eye Distance:   Left Eye Distance:   Bilateral Distance:    Right Eye Near:   Left Eye Near:    Bilateral Near:     Physical Exam HENT:     Head: Normocephalic and atraumatic.     Right Ear: Tympanic membrane normal.     Left Ear: Tympanic membrane normal.  Cardiovascular:     Rate and Rhythm: Normal rate and regular rhythm.  Pulmonary:     Effort: Pulmonary effort is normal.     Breath sounds: Normal breath sounds.  Musculoskeletal:        General: Normal range of motion.     Cervical back: Normal range of motion.  Skin:    General: Skin is warm and dry.     Capillary Refill: Capillary refill takes less than 2 seconds.  Neurological:     General: No focal deficit present.     Mental Status: He is alert and oriented to person, place, and time.     Cranial Nerves: No cranial nerve deficit.     Motor: No weakness.     Coordination:  Coordination normal.     Gait: Gait normal.  Psychiatric:        Mood and Affect: Mood normal.        Behavior: Behavior normal.        Thought Content: Thought content normal.        Judgment: Judgment normal.      UC Treatments / Results  Labs (all labs ordered are listed, but only abnormal results are displayed) Labs Reviewed  POCT URINALYSIS DIP (MANUAL ENTRY)  POCT FASTING CBG [facility] MANUAL ENTRY    EKG   Radiology No results found.  Procedures Procedures (including critical care time)  Medications Ordered in UC Medications - No data to display  Initial Impression / Assessment and Plan / UC Course  I have reviewed the triage vital signs and the nursing notes.  Pertinent labs & imaging results that were available during my care of the patient were reviewed by me and considered in my medical decision making (see chart for details).    UA and fasting glucose both unremarkable.  Suspect insomnia is likely of a psychogenetic etiology however given the setting of urgent care this would not be the appropriate setting to work-up such as source.  Therefore advised patient that I will initiate treatment of insomnia with hydroxyzine and also encourage contaminant use of melatonin with hydroxyzine to achieve rest.  Patient was advised to contact primary care provider to schedule a complete physical exam along with evaluation of insomnia as he may require further evaluation in the setting of neurology and/or other specialty if symptoms continue.  Overall physical exam findings, specifically neurologically patient is grossly intact.  Patient vies that if any of his symptoms worsen in severity or he is unable to achieve sleep go immediately to the emergency department for further work-up and evaluation. Final Clinical Impressions(s) / UC Diagnoses   Final diagnoses:  Insomnia, unspecified type  Dizziness, nonspecific     Discharge Instructions     Try hydroxyzine 50-100 mg one hour  prior to bedtime for management of insomnia. You may also add over the counter melatonin to increase effect of hydroxyzine. Follow-up with your primary care doctor for further work-up and management of the other symptoms you are experiencing. You are overdue for a physical exam, I would recommend starting with an annual exam and if insomnia remains an issue, further work-up and  evaluation .    ED Prescriptions    Medication Sig Dispense Auth. Provider   hydrOXYzine (ATARAX/VISTARIL) 50 MG tablet Take 1-2 tablets (50-100 mg total) by mouth at bedtime. 60 tablet Bing Neighbors, FNP     PDMP not reviewed this encounter.   Bing Neighbors, [place] 07/01/20 [id number]

## 2020-06-27 NOTE — ED Triage Notes (Signed)
Pt c/o insomnia and headache x 5 days. Also c/o waves of shakiness and confusion. Eating and drinking ok. Had covid in February. Sxs were mild. Tried nyquil over the weekend to try and sleep. Had opposite affect.

## 2021-02-20 ENCOUNTER — Ambulatory Visit: Payer: 59 | Admitting: Family Medicine

## 2021-02-20 ENCOUNTER — Ambulatory Visit (HOSPITAL_COMMUNITY)
Admission: EM | Admit: 2021-02-20 | Discharge: 2021-02-20 | Disposition: A | Discharge status: Home / Self Care | Payer: 59 | Attending: Emergency Medicine | Admitting: Emergency Medicine

## 2021-02-20 ENCOUNTER — Other Ambulatory Visit: Payer: Self-pay

## 2021-02-20 ENCOUNTER — Other Ambulatory Visit (HOSPITAL_COMMUNITY): Payer: Self-pay

## 2021-02-20 ENCOUNTER — Encounter (HOSPITAL_COMMUNITY): Payer: Self-pay

## 2021-02-20 DIAGNOSIS — J029 Acute pharyngitis, unspecified: Secondary | ICD-10-CM | POA: Diagnosis not present

## 2021-02-20 LAB — POCT RAPID STREP A, ED / UC: Streptococcus, Group A Screen (Direct): NEGATIVE

## 2021-02-20 LAB — POC INFLUENZA A AND B ANTIGEN (URGENT CARE ONLY)
INFLUENZA A ANTIGEN, POC: NEGATIVE
INFLUENZA B ANTIGEN, POC: NEGATIVE

## 2021-02-20 LAB — HIV ANTIBODY (ROUTINE TESTING W REFLEX): HIV Screen 4th Generation wRfx: NONREACTIVE

## 2021-02-20 MED ORDER — METHYLPREDNISOLONE SODIUM SUCC 125 MG IJ SOLR
INTRAMUSCULAR | Status: AC
  Filled 2021-02-20: qty 2

## 2021-02-20 MED ORDER — METHYLPREDNISOLONE SODIUM SUCC 125 MG IJ SOLR
60.0000 mg | Freq: Once | INTRAMUSCULAR | Status: AC
  Administered 2021-02-20: 60 mg via INTRAMUSCULAR

## 2021-02-20 MED ORDER — LIDOCAINE VISCOUS HCL 2 % MT SOLN
15.0000 mL | OROMUCOSAL | 0 refills | Status: AC | PRN
  Filled 2021-02-20: qty 100, 7d supply, fill #0

## 2021-02-20 NOTE — ED Triage Notes (Signed)
States recent partner asked for him to be tested for Syphilis, HIV and herpes.

## 2021-02-20 NOTE — ED Triage Notes (Signed)
Pt presents with swelling in tonsils. States he has had yellow mucus come out of his mouth. States he feels there is something stuck in his throat.  Presents for STD testing. States he recently had unprotected sex with a new partner.

## 2021-02-20 NOTE — Discharge Instructions (Addendum)
Your rapid strep test is negative, it has been sent to the lab to see if it grows bacteria, if this occurs and he will be notified and medication sent in  Your flu test is pending, you will be called if positive you may also see results on your MyChart  You may use viscous lidocaine every 4 hours as needed to help with pain, gargle and spit, please continue use of ibuprofen  You have been given information for the throat specialist, please call and make an appointment  You have been given and steroid injection today in office to help reduce the inflammation of your tonsils   Labs pending 2-3 days, you will be contacted if positive for any sti and treatment will be sent to the pharmacy, you will have to return to the clinic if positive for gonorrhea to receive treatment   Please refrain from having sex until labs results, if positive please refrain from having sex until treatment complete and symptoms resolve   If positive for HIV, Syphilis, Chlamydia  gonorrhea or trichomoniasis please notify partner or partners so they may tested as well  Moving forward, it is recommended you use some form of protection against the transmission of sti infections  such as condoms or dental dams with each sexual encounter

## 2021-02-20 NOTE — ED Provider Notes (Signed)
[facility]    CSN: [id number] Arrival date & time: 02/20/21  8413      History   Chief Complaint Chief Complaint  Patient presents with   Sore Throat   SEXUALLY TRANSMITTED DISEASE    HPI Ronald Diaz is a 30 y.o. male.   Patient presents with sore throat, enlarged tonsils and swollen lymph nodes.  Endorses this has been an issue for years but has worsened in severity over the last 3 to 4 days.  Endorses that he has coughed up yellow phlegm.  Tolerating food and liquids.  Has been using ibuprofen which does provide some comfort.  Denies fever, chills, body aches, shortness of breath, wheezing, ear pain, headaches, dental pain, facial swelling, difficulty swallowing or difficulty breathing.  Patient requesting STI testing for syphilis, HIV and herpes.  Sexually active, 2 partners, sometimes condom use.  Denies all symptoms.  Past Medical History:  Diagnosis Date   WPW (Wolff-Parkinson-Broderick Fonseca syndrome)     There are no problems to display for this patient.   Past Surgical History:  Procedure Laterality Date   ABLATION         Home Medications    Prior to Admission medications   Medication Sig Start Date End Date Taking? Authorizing Provider  lidocaine (XYLOCAINE) 2 % solution Use as directed 15 mLs in the mouth or throat as needed for mouth pain. 02/20/21  Yes Annikah Lovins R, NP  albuterol (VENTOLIN HFA) 108 (90 Base) MCG/ACT inhaler Inhale 1-2 puffs into the lungs every 6 (six) hours as needed for wheezing or shortness of breath. 05/02/19   Arthor Captain, PA-C  hydrOXYzine (ATARAX/VISTARIL) 50 MG tablet Take 1-2 tablets (50-100 mg total) by mouth at bedtime. 06/27/20   Bing Neighbors, FNP  methylPREDNISolone (MEDROL DOSEPAK) 4 MG TBPK tablet Use as directed 05/02/19   Arthor Captain, PA-C    Family History History reviewed. No pertinent family history.  Social History Social History   Tobacco Use   Smoking status: Never   Smokeless tobacco:  Never  Vaping Use   Vaping Use: Never used  Substance Use Topics   Alcohol use: Yes    Comment: rare   Drug use: No     Allergies   Patient has no known allergies.   Review of Systems Review of Systems  Constitutional: Negative.   HENT:  Positive for sore throat. Negative for congestion, dental problem, drooling, ear discharge, ear pain, facial swelling, hearing loss, mouth sores, nosebleeds, postnasal drip, rhinorrhea, sinus pressure, sinus pain, sneezing, tinnitus, trouble swallowing and voice change.   Respiratory: Negative.    Cardiovascular: Negative.   Genitourinary: Negative.   Skin: Negative.   Neurological: Negative.     Physical Exam Triage Vital Signs ED Triage Vitals  Enc Vitals Group     BP 02/20/21 1031 (!) 147/108     Pulse Rate 02/20/21 1029 (!) 108     Resp 02/20/21 1029 18     Temp --      Temp src --      SpO2 02/20/21 1029 96 %     Weight --      Height --      Head Circumference --      Peak Flow --      Pain Score 02/20/21 1028 3     Pain Loc --      Pain Edu? --      Excl. in GC? --    No data found.  Updated Vital  Signs BP (!) 147/108 (BP Location: Left Arm)   Pulse (!) 108   Resp 18   SpO2 96%   Visual Acuity Right Eye Distance:   Left Eye Distance:   Bilateral Distance:    Right Eye Near:   Left Eye Near:    Bilateral Near:     Physical Exam Constitutional:      Appearance: Normal appearance. He is normal weight.  HENT:     Head: Normocephalic.     Right Ear: Tympanic membrane and ear canal normal.     Left Ear: Tympanic membrane and ear canal normal.     Mouth/Throat:     Mouth: Mucous membranes are moist.     Pharynx: Posterior oropharyngeal erythema present.     Tonsils: Tonsillar exudate present. No tonsillar abscesses. 3+ on the right. 3+ on the left.  Eyes:     Extraocular Movements: Extraocular movements intact.  Cardiovascular:     Rate and Rhythm: Normal rate and regular rhythm.     Pulses: Normal pulses.      Heart sounds: Normal heart sounds.  Pulmonary:     Effort: Pulmonary effort is normal.     Breath sounds: Normal breath sounds.  Genitourinary:    Comments: Deferred self collected urethral swab Lymphadenopathy:     Cervical: Cervical adenopathy present.  Skin:    General: Skin is warm and dry.  Neurological:     Mental Status: He is alert and oriented to person, place, and time. Mental status is at baseline.  Psychiatric:        Mood and Affect: Mood normal.        Behavior: Behavior normal.     UC Treatments / Results  Labs (all labs ordered are listed, but only abnormal results are displayed) Labs Reviewed  CULTURE, GROUP A STREP ([facility])  HIV ANTIBODY (ROUTINE TESTING W REFLEX)  RPR  POC INFLUENZA A AND B ANTIGEN (URGENT CARE ONLY)  POCT RAPID STREP A, ED / UC  CYTOLOGY, (ORAL, ANAL, URETHRAL) ANCILLARY ONLY    EKG   Radiology No results found.  Procedures Procedures (including critical care time)  Medications Ordered in UC Medications  methylPREDNISolone sodium succinate (SOLU-MEDROL) 125 mg/2 mL injection 60 mg (60 mg Intramuscular Given 02/20/21 1125)    Initial Impression / Assessment and Plan / UC Course  I have reviewed the triage vital signs and the nursing notes.  Pertinent labs & imaging results that were available during my care of the patient were reviewed by me and considered in my medical decision making (see chart for details).  Sore throat  Methylprednisolone 60 mg IM now  1.  Rapid strep negative, sent for culture 2.  Lidocaine viscous 2% 15 mils every 4 hours as 3.  Encourage continued use of ibuprofen 4.  Given walking referral to ENT 5.  Flu test pending 6.  STI screening pending, advised abstinence until labs results and/or treatment is complete, advised condom use moving forward, notified patient that we do not complete herpes testing without lesion present, verbalized understanding Final Clinical Impressions(s) / UC Diagnoses    Final diagnoses:  Strep throat     Discharge Instructions      Your rapid strep test is negative, it has been sent to the lab to see if it grows bacteria, if this occurs and he will be notified and medication sent in  Your flu test is pending, you will be called if positive you may also see results on your MyChart  You may use viscous lidocaine every 4 hours as needed to help with pain, gargle and spit, please continue use of ibuprofen  You have been given information for the throat specialist, please call and make an appointment  You have been given and steroid injection today in office to help reduce the inflammation of your tonsils   Labs pending 2-3 days, you will be contacted if positive for any sti and treatment will be sent to the pharmacy, you will have to return to the clinic if positive for gonorrhea to receive treatment   Please refrain from having sex until labs results, if positive please refrain from having sex until treatment complete and symptoms resolve   If positive for HIV, Syphilis, Chlamydia  gonorrhea or trichomoniasis please notify partner or partners so they may tested as well  Moving forward, it is recommended you use some form of protection against the transmission of sti infections  such as condoms or dental dams with each sexual encounter     ED Prescriptions     Medication Sig Dispense Auth. Provider   lidocaine (XYLOCAINE) 2 % solution Use as directed 15 mLs in the mouth or throat as needed for mouth pain. 100 mL Valinda Hoar, NP      PDMP not reviewed this encounter.   Valinda Hoar, NP 02/20/21 1137

## 2021-02-21 LAB — CYTOLOGY, (ORAL, ANAL, URETHRAL) ANCILLARY ONLY
Chlamydia: NEGATIVE
Comment: NEGATIVE
Comment: NEGATIVE
Comment: NORMAL
Neisseria Gonorrhea: NEGATIVE
Trichomonas: NEGATIVE

## 2021-02-21 LAB — RPR: RPR Ser Ql: NONREACTIVE

## 2021-02-22 LAB — CULTURE, GROUP A STREP (THRC)

## 2021-06-24 IMAGING — DX DG CHEST 1V PORT
1 series · 1 of 1 positions shown · non-contrast
Comparison: None.

CLINICAL DATA: C/o cough, body aches and fever x 1 wk but started
having light pink hemoptysis when coughing since yesterday. Had
positive COVID test on [REDACTED] this week

EXAM:
PORTABLE CHEST 1 VIEW

[chest ap]
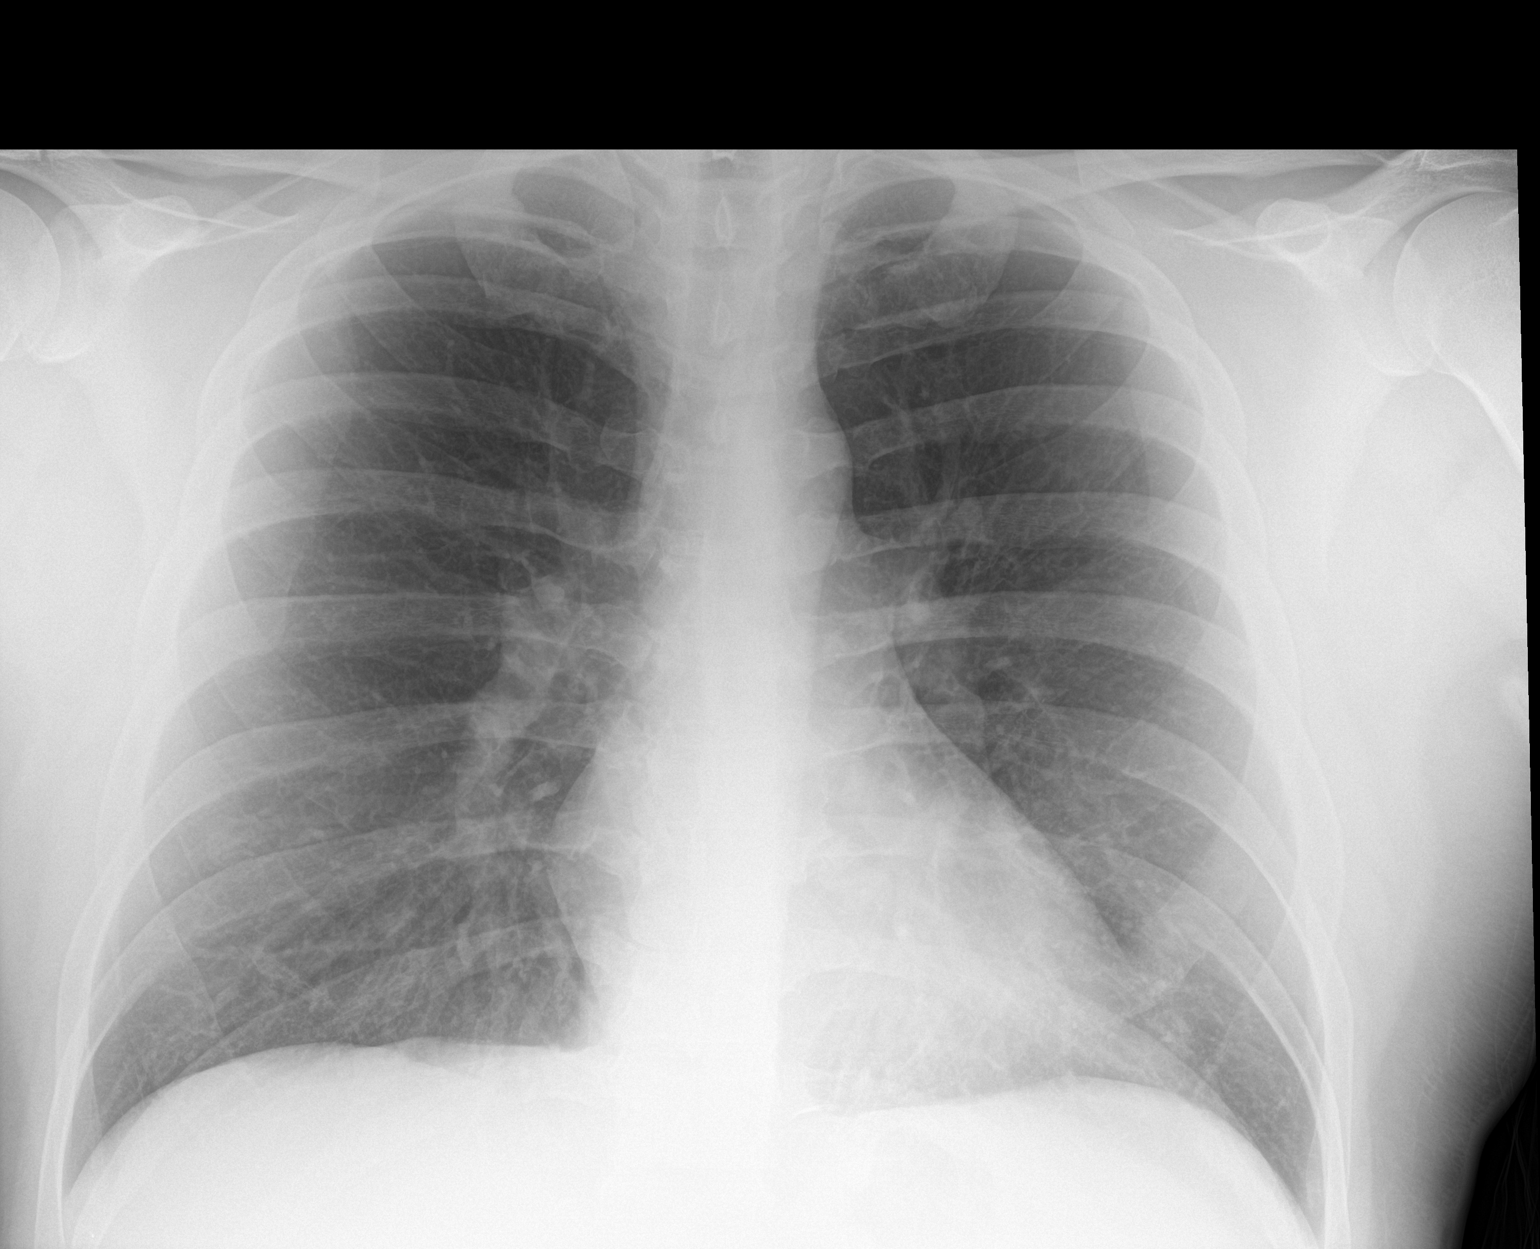

[1 of 1 positions shown; findings below may reference images not displayed]

FINDINGS: The heart size and mediastinal contours are within normal limits.
The lungs are clear. No pneumothorax or significant pleural
effusion. No acute finding in the visualized skeleton.
IMPRESSION: No evidence of active disease in the chest.
